# Patient Record
Sex: Male | Born: 1995 | Race: Black or African American | Hispanic: No | Marital: Single | Smoking: Current every day smoker
Health system: Southern US, Community
[De-identification: ages and names within clinical notes are randomized; demographics above are authoritative.]

## PROBLEM LIST (undated history)

## (undated) DIAGNOSIS — J45909 Unspecified asthma, uncomplicated: Secondary | ICD-10-CM

---

## 2017-12-05 ENCOUNTER — Encounter: Payer: Self-pay | Admitting: Emergency Medicine

## 2017-12-05 ENCOUNTER — Other Ambulatory Visit: Payer: Self-pay

## 2017-12-05 ENCOUNTER — Emergency Department
Admission: EM | Admit: 2017-12-05 | Discharge: 2017-12-05 | Disposition: A | Discharge status: Home / Self Care | Payer: Self-pay | Attending: Emergency Medicine | Admitting: Emergency Medicine

## 2017-12-05 ENCOUNTER — Emergency Department: Payer: Self-pay

## 2017-12-05 DIAGNOSIS — Y929 Unspecified place or not applicable: Secondary | ICD-10-CM | POA: Insufficient documentation

## 2017-12-05 DIAGNOSIS — Z23 Encounter for immunization: Secondary | ICD-10-CM | POA: Insufficient documentation

## 2017-12-05 DIAGNOSIS — Y939 Activity, unspecified: Secondary | ICD-10-CM | POA: Insufficient documentation

## 2017-12-05 DIAGNOSIS — S62667A Nondisplaced fracture of distal phalanx of left little finger, initial encounter for closed fracture: Secondary | ICD-10-CM | POA: Insufficient documentation

## 2017-12-05 DIAGNOSIS — Y33XXXA Other specified events, undetermined intent, initial encounter: Secondary | ICD-10-CM | POA: Insufficient documentation

## 2017-12-05 DIAGNOSIS — Y998 Other external cause status: Secondary | ICD-10-CM | POA: Insufficient documentation

## 2017-12-05 DIAGNOSIS — J45909 Unspecified asthma, uncomplicated: Secondary | ICD-10-CM | POA: Insufficient documentation

## 2017-12-05 DIAGNOSIS — F17210 Nicotine dependence, cigarettes, uncomplicated: Secondary | ICD-10-CM | POA: Insufficient documentation

## 2017-12-05 HISTORY — DX: Unspecified asthma, uncomplicated: J45.909

## 2017-12-05 LAB — CBC WITH DIFFERENTIAL/PLATELET
BASOS ABS: 0 10*3/uL (ref 0–0.1)
BASOS PCT: 1 %
EOS ABS: 0.2 10*3/uL (ref 0–0.7)
EOS PCT: 5 %
HCT: 44.9 % (ref 40.0–52.0)
Hemoglobin: 15.1 g/dL (ref 13.0–18.0)
Lymphocytes Relative: 22 %
Lymphs Abs: 1 10*3/uL (ref 1.0–3.6)
MCH: 29.6 pg (ref 26.0–34.0)
MCHC: 33.6 g/dL (ref 32.0–36.0)
MCV: 88.3 fL (ref 80.0–100.0)
MONO ABS: 0.5 10*3/uL (ref 0.2–1.0)
Monocytes Relative: 10 %
Neutro Abs: 3 10*3/uL (ref 1.4–6.5)
Neutrophils Relative %: 62 %
PLATELETS: 297 10*3/uL (ref 150–440)
RBC: 5.09 MIL/uL (ref 4.40–5.90)
RDW: 13.5 % (ref 11.5–14.5)
WBC: 4.8 10*3/uL (ref 3.8–10.6)

## 2017-12-05 LAB — COMPREHENSIVE METABOLIC PANEL
ALK PHOS: 112 U/L (ref 38–126)
ALT: 28 U/L (ref 17–63)
AST: 39 U/L (ref 15–41)
Albumin: 4.1 g/dL (ref 3.5–5.0)
Anion gap: 10 (ref 5–15)
BILIRUBIN TOTAL: 0.8 mg/dL (ref 0.3–1.2)
BUN: 11 mg/dL (ref 6–20)
CALCIUM: 9.1 mg/dL (ref 8.9–10.3)
CO2: 24 mmol/L (ref 22–32)
CREATININE: 1.07 mg/dL (ref 0.61–1.24)
Chloride: 104 mmol/L (ref 101–111)
GFR calc Af Amer: >60 mL/min (ref >60)
GLUCOSE: 116 mg/dL — AB (ref 65–99)
Potassium: 3.7 mmol/L (ref 3.5–5.1)
Sodium: 138 mmol/L (ref 135–145)
TOTAL PROTEIN: 7.5 g/dL (ref 6.5–8.1)

## 2017-12-05 MED ORDER — OXYCODONE-ACETAMINOPHEN 5-325 MG PO TABS: 1.0000 | ORAL_TABLET | ORAL | 0 refills | Status: DC | PRN

## 2017-12-05 MED ORDER — CEPHALEXIN 500 MG PO CAPS: 500.0000 mg | ORAL_CAPSULE | Freq: Three times a day (TID) | ORAL | 0 refills | Status: AC

## 2017-12-05 MED ORDER — CEPHALEXIN 500 MG PO CAPS: 500.0000 mg | ORAL_CAPSULE | Freq: Four times a day (QID) | ORAL | 0 refills | Status: DC

## 2017-12-05 MED ORDER — CEPHALEXIN 500 MG PO CAPS: 500.0000 mg | ORAL_CAPSULE | Freq: Three times a day (TID) | ORAL | 0 refills | Status: DC

## 2017-12-05 MED ORDER — OXYCODONE-ACETAMINOPHEN 5-325 MG PO TABS
1.0000 | ORAL_TABLET | Freq: Once | ORAL | Status: AC
  Administered 2017-12-05: 1 via ORAL
  Filled 2017-12-05: qty 1

## 2017-12-05 MED ORDER — TETANUS-DIPHTH-ACELL PERTUSSIS 5-2.5-18.5 LF-MCG/0.5 IM SUSP
INTRAMUSCULAR | Status: AC
  Filled 2017-12-05: qty 0.5

## 2017-12-05 MED ORDER — TETANUS-DIPHTH-ACELL PERTUSSIS 5-2.5-18.5 LF-MCG/0.5 IM SUSP
0.5000 mL | Freq: Once | INTRAMUSCULAR | Status: AC
  Administered 2017-12-05: 0.5 mL via INTRAMUSCULAR

## 2017-12-05 MED ORDER — CEFAZOLIN SODIUM-DEXTROSE 1-4 GM/50ML-% IV SOLN
1.0000 g | Freq: Once | INTRAVENOUS | Status: AC
  Administered 2017-12-05: 1 g via INTRAVENOUS
  Filled 2017-12-05: qty 50
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
## 2017-12-05 NOTE — ED Notes (Signed)
Notified pharmacy of the need for Ancef for this patient.

## 2017-12-05 NOTE — Discharge Instructions (Addendum)
if any signs of infection including increased pain, fever, vomiting, pus from the area, swelling, red streaks from the area or other concerns return to the emergency room.  Follow up on Monday without fail with orthopedic surgery.  Do not drink or drive on pain medications and be careful drinking to excess.
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
## 2019-07-06 IMAGING — CR DG FINGER LITTLE 2+V*L*
1 series · 3 of 3 positions shown · non-contrast
Comparison: None.

CLINICAL DATA: Wound left little finger X 2 days.Per patient,
either caught finger in closing door or has Tamika Trost in finger.

EXAM:
LEFT LITTLE FINGER 2+V

[Series 4: x finger pa left · 0.14mm/px · 3 of 3 slices shown]
[im 1/3]
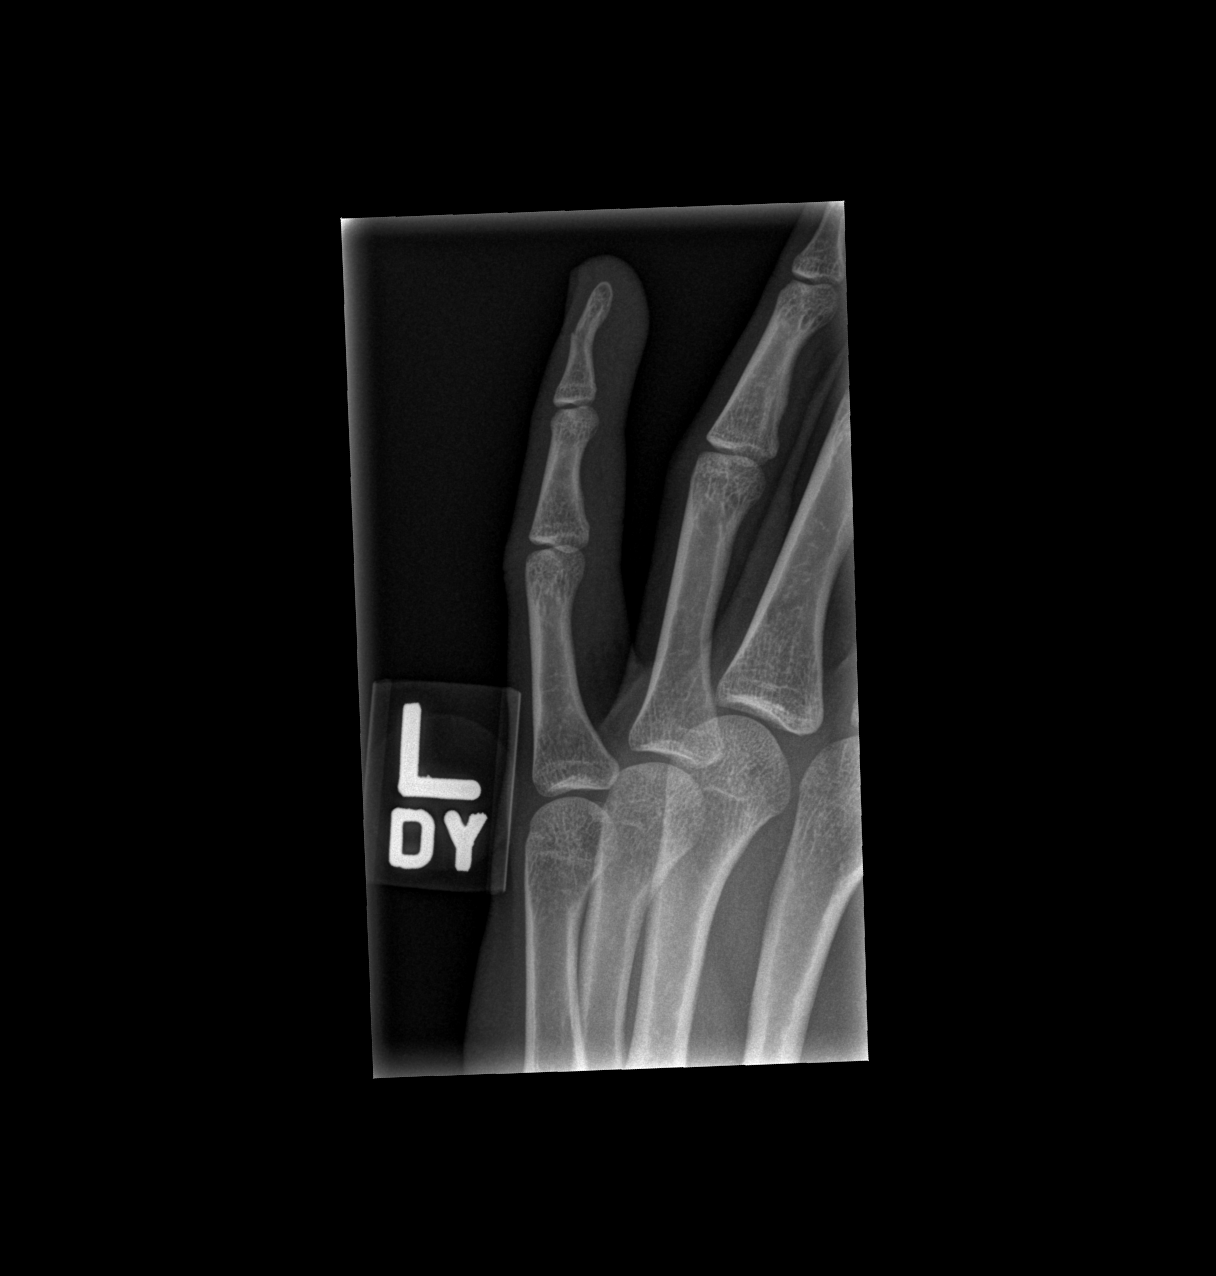
[im 2/3]
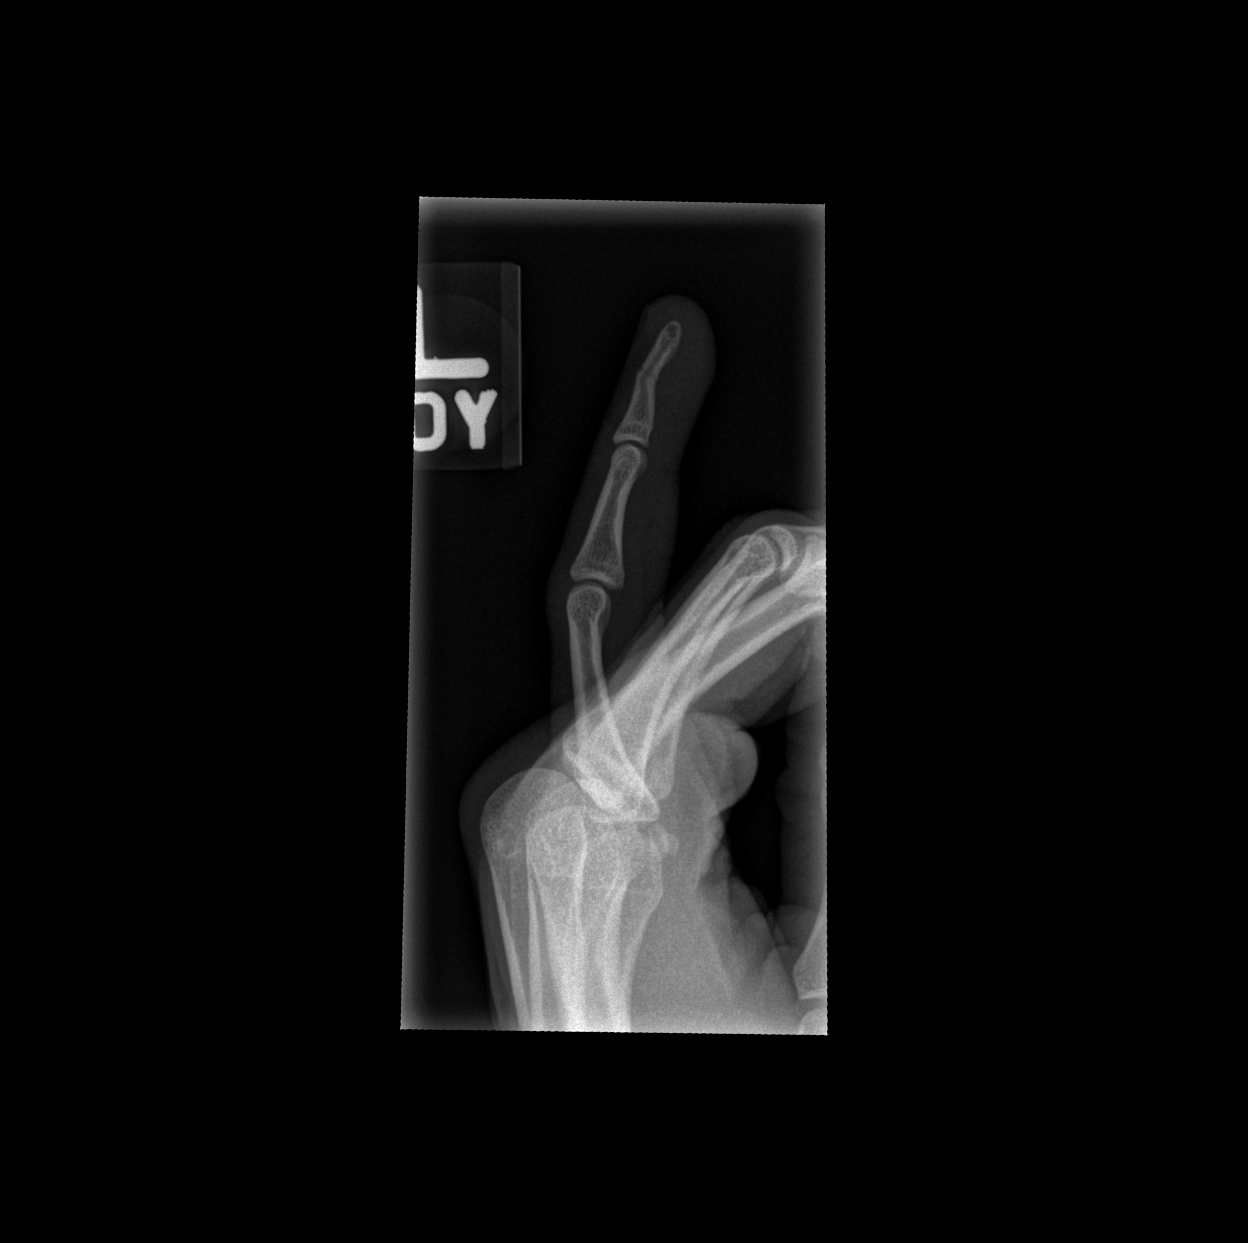
[im 3/3]
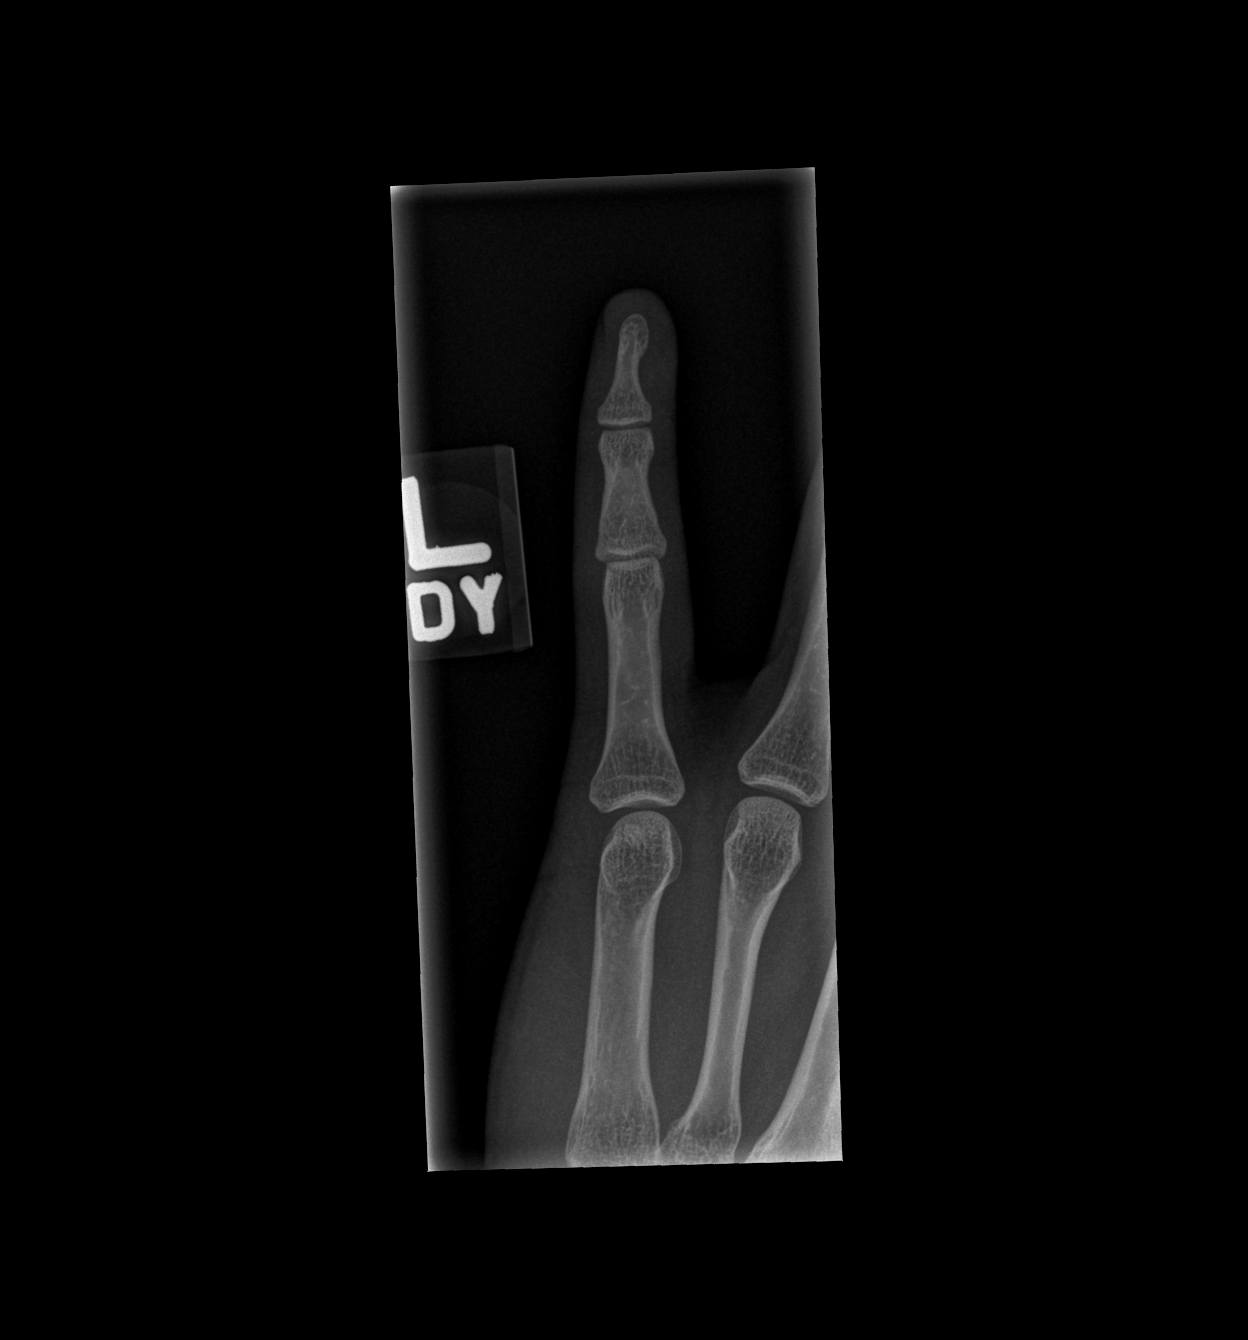

[3 of 3 positions shown; findings below may reference images not displayed]

FINDINGS: There is a transverse fracture of the midshaft of the distal phalanx
of the left fifth finger. Fracture is nondisplaced and non
comminuted. There is minor palmar angulation.

No other fractures.  The joints are normally spaced and aligned.
IMPRESSION: 1. Transverse, nondisplaced, non comminuted fracture of the midshaft
of the left fifth finger distal phalanx with Juan Felipe Chiu/anterior
angulation.
2. No radiopaque foreign body.

## 2020-10-19 ENCOUNTER — Other Ambulatory Visit: Payer: Self-pay

## 2020-10-19 ENCOUNTER — Emergency Department: Payer: BC Managed Care – PPO

## 2020-10-19 ENCOUNTER — Emergency Department
Admission: EM | Admit: 2020-10-19 | Discharge: 2020-10-19 | Disposition: A | Discharge status: Home / Self Care | Payer: BC Managed Care – PPO | Attending: Emergency Medicine | Admitting: Emergency Medicine

## 2020-10-19 DIAGNOSIS — Y9389 Activity, other specified: Secondary | ICD-10-CM | POA: Insufficient documentation

## 2020-10-19 DIAGNOSIS — S99912A Unspecified injury of left ankle, initial encounter: Secondary | ICD-10-CM | POA: Diagnosis present

## 2020-10-19 DIAGNOSIS — J45909 Unspecified asthma, uncomplicated: Secondary | ICD-10-CM | POA: Diagnosis not present

## 2020-10-19 DIAGNOSIS — X501XXA Overexertion from prolonged static or awkward postures, initial encounter: Secondary | ICD-10-CM | POA: Diagnosis not present

## 2020-10-19 DIAGNOSIS — F17210 Nicotine dependence, cigarettes, uncomplicated: Secondary | ICD-10-CM | POA: Insufficient documentation

## 2020-10-19 DIAGNOSIS — S93492A Sprain of other ligament of left ankle, initial encounter: Secondary | ICD-10-CM | POA: Diagnosis not present

## 2020-10-19 NOTE — ED Provider Notes (Signed)
[facility] Emergency Department Provider Note  ____________________________________________   Event Date/Time   First MD Initiated Contact with Patient 10/19/20 1224     (approximate)  I have reviewed the triage vital signs and the nursing notes.   HISTORY  Chief Complaint Ankle Pain  HPI Greg Flores is a 25 y.o. male who presents to the emergency department for evaluation of left ankle pain.  Patient states that he inverted his ankle when playing with a friend last night and had onset of pain.  He has been able to weight-bear but with increased pain.  He states he has history of multiple ankle sprains to both ankles before.  He rates his pain a 4/10, is worse with weightbearing.  He has not tried any alleviating measures.         Past Medical History:  Diagnosis Date  . Asthma     There are no problems to display for this patient.   History reviewed. No pertinent surgical history.  Prior to Admission medications   Not on File    Allergies Patient has no known allergies.  No family history on file.  Social History Social History   Tobacco Use  . Smoking status: Current Every Day Smoker    Types: E-cigarettes  . Smokeless tobacco: Never Used  Substance Use Topics  . Alcohol use: Yes  . Drug use: Yes    Types: Marijuana    Review of Systems Constitutional: No fever/chills Eyes: No visual changes. ENT: No sore throat. Cardiovascular: Denies chest pain. Respiratory: Denies shortness of breath. Gastrointestinal: No abdominal pain.  No nausea, no vomiting.  No diarrhea.  No constipation. Genitourinary: Negative for dysuria. Musculoskeletal: + Left ankle pain, negative for back pain. Skin: Negative for rash. Neurological: Negative for headaches, focal weakness or numbness.   ____________________________________________   PHYSICAL EXAM:  VITAL SIGNS: ED Triage Vitals  Enc Vitals Group     BP 10/19/20 1123 106/65     Pulse  Rate 10/19/20 1123 83     Resp 10/19/20 1123 17     Temp 10/19/20 1123 98 F (36.7 C)     Temp Source 10/19/20 1123 Oral     SpO2 10/19/20 1123 98 %     Weight 10/19/20 1124 150 lb (68 kg)     Height 10/19/20 1124 5\' 10"  (1.778 m)     Head Circumference --      Peak Flow --      Pain Score 10/19/20 1123 6     Pain Loc --      Pain Edu? --      Excl. in GC? --     Constitutional: Alert and oriented. Well appearing and in no acute distress. Eyes: Conjunctivae are normal. PERRL. EOMI. Head: Atraumatic. Nose: No congestion/rhinnorhea. Musculoskeletal: There is a mild to moderate amount of swelling over the dorsum of the lateral left ankle.  Patient is tender to touch in the region of the lateral ligaments, including ATFL.  Patient is nontender to palpation of the distal fibula, proximal fifth metatarsal.  Patient has full range of motion but with increased pain over the lateral ankle.  Dorsal pedal pulse 2+, capillary refill less than 3 seconds. Neurologic:  Normal speech and language. No gross focal neurologic deficits are appreciated. No gait instability. Skin:  Skin is warm, dry and intact. No rash noted. Psychiatric: Mood and affect are normal. Speech and behavior are normal. ____________________________________________  RADIOLOGY I, 10/21/20, personally viewed and  evaluated these images (plain radiographs) as part of my medical decision making, as well as reviewing the written report by the radiologist.  ED provider interpretation: No acute fracture identified.  Official radiology report(s): DG Ankle Complete Left  Result Date: 10/19/2020 CLINICAL DATA:  pain/injury EXAM: LEFT ANKLE COMPLETE - 3+ VIEW COMPARISON:  None. FINDINGS: There is no evidence of fracture, dislocation, or joint effusion. There is no evidence of arthropathy or other focal bone abnormality. Soft tissues are unremarkable. IMPRESSION: No acute osseous abnormality. Electronically Signed   By: Stana Bunting M.D.   On: 10/19/2020 11:52    ____________________________________________   INITIAL IMPRESSION / ASSESSMENT AND PLAN / ED COURSE  As part of my medical decision making, I reviewed the following data within the electronic MEDICAL RECORD NUMBER Nursing notes reviewed and incorporated and Radiograph reviewed         Patient is a 25 year old male who presents to the emergency department for evaluation of left ankle pain.  See HPI for further details.  On physical exam, the patient does have a mild to moderate amount of swelling over the lateral aspect of the left ankle.  He has full range of motion, is nontender over the bony prominences.  X-rays do not reveal any acute fracture.  Will place patient in an ASO lace up ankle brace.  Recommended alternating Tylenol or ibuprofen for symptom management.  Advised patient he can weight-bear as tolerated.  We will have the patient follow-up with orthopedics if he has any persisting issues.  Patient was amenable with this plan, he will return with any acute worsening.      ____________________________________________   FINAL CLINICAL IMPRESSION(S) / ED DIAGNOSES  Final diagnoses:  Sprain of anterior talofibular ligament of left ankle, initial encounter     ED Discharge Orders    None      *Please note:  Greg Flores was evaluated in Emergency Department on 10/19/2020 for the symptoms described in the history of present illness. He was evaluated in the context of the global COVID-19 pandemic, which necessitated consideration that the patient might be at risk for infection with the SARS-CoV-2 virus that causes COVID-19. Institutional protocols and algorithms that pertain to the evaluation of patients at risk for COVID-19 are in a state of rapid change based on information released by regulatory bodies including the CDC and federal and state organizations. These policies and algorithms were followed during the patient's care in the ED.  Some ED  evaluations and interventions may be delayed as a result of limited staffing during and the pandemic.*   Note:  This document was prepared using Dragon voice recognition software and may include unintentional dictation errors.   Lucy Chris, PA 10/19/20 1608    Dionne Bucy, MD 10/20/20 [phone]

## 2020-10-19 NOTE — ED Triage Notes (Signed)
Pt states he picked up one of his friends last night and rolled his left ankle and is now having pain and swelling

## 2022-05-20 IMAGING — CR DG ANKLE COMPLETE 3+V*L*
1 series · 3 of 3 positions shown · non-contrast
Comparison: None.

CLINICAL DATA: pain/injury

EXAM:
LEFT ANKLE COMPLETE - 3+ VIEW

[Series 2: x ankle ap left · 0.14mm/px · 3 of 3 slices shown]
[im 1/3]
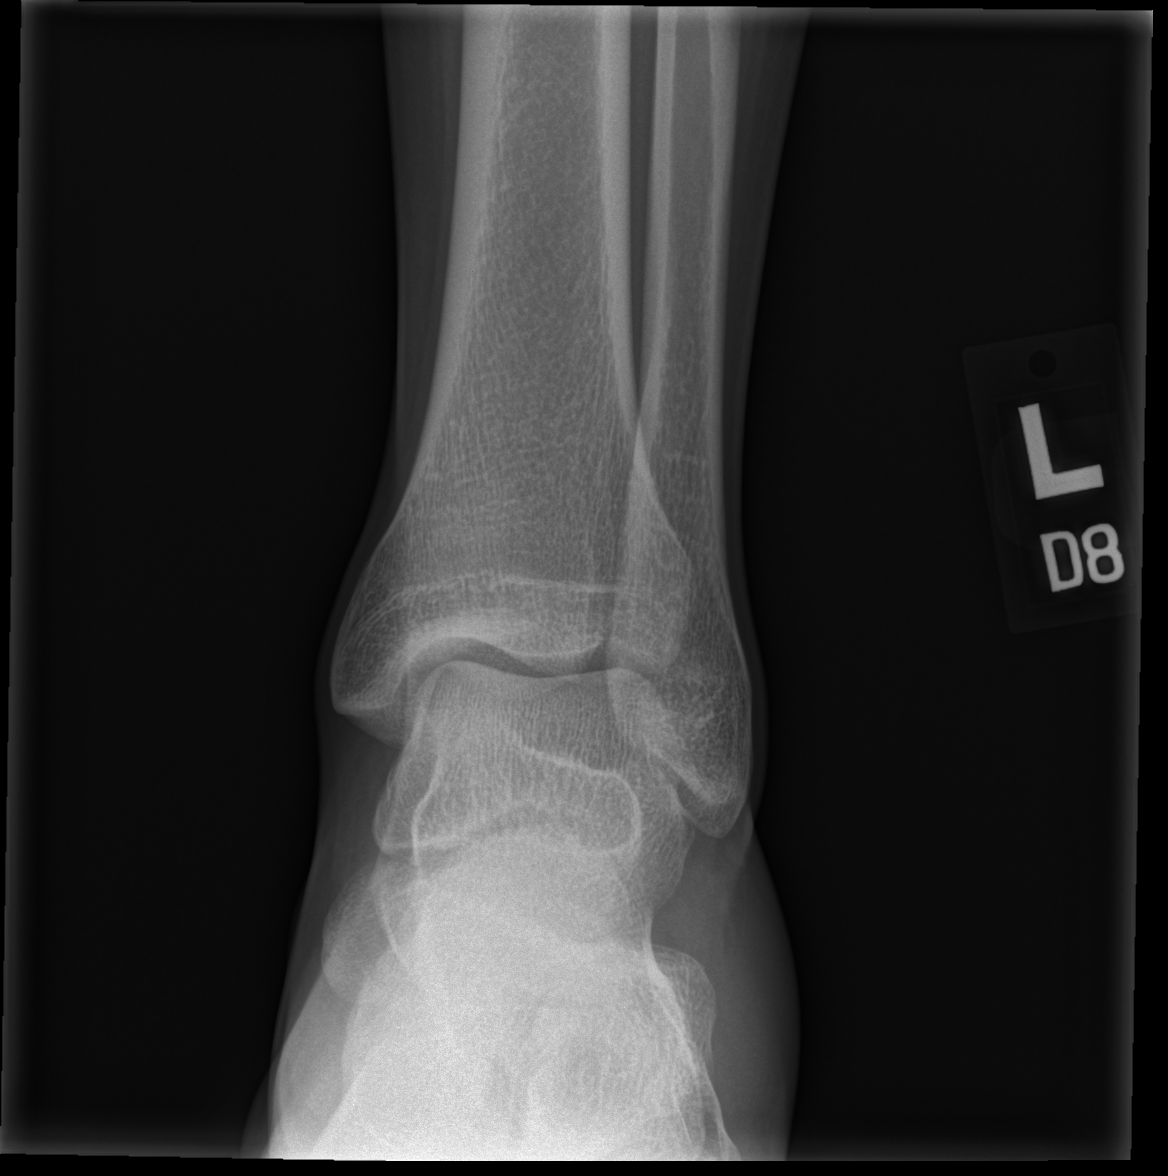
[im 2/3]
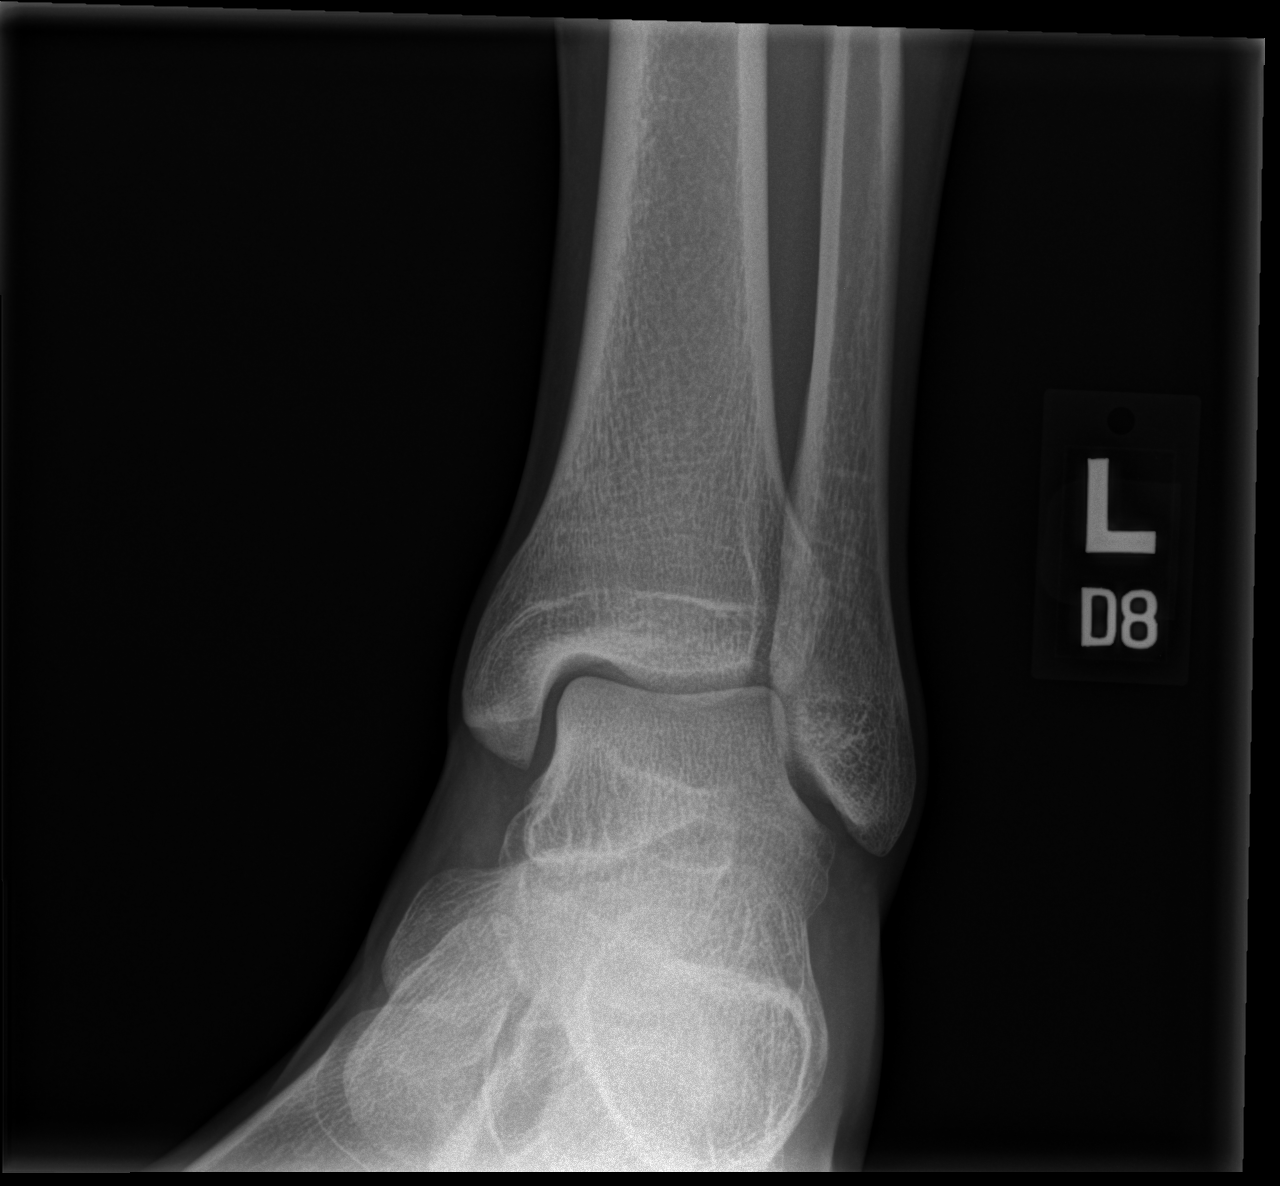
[im 3/3]
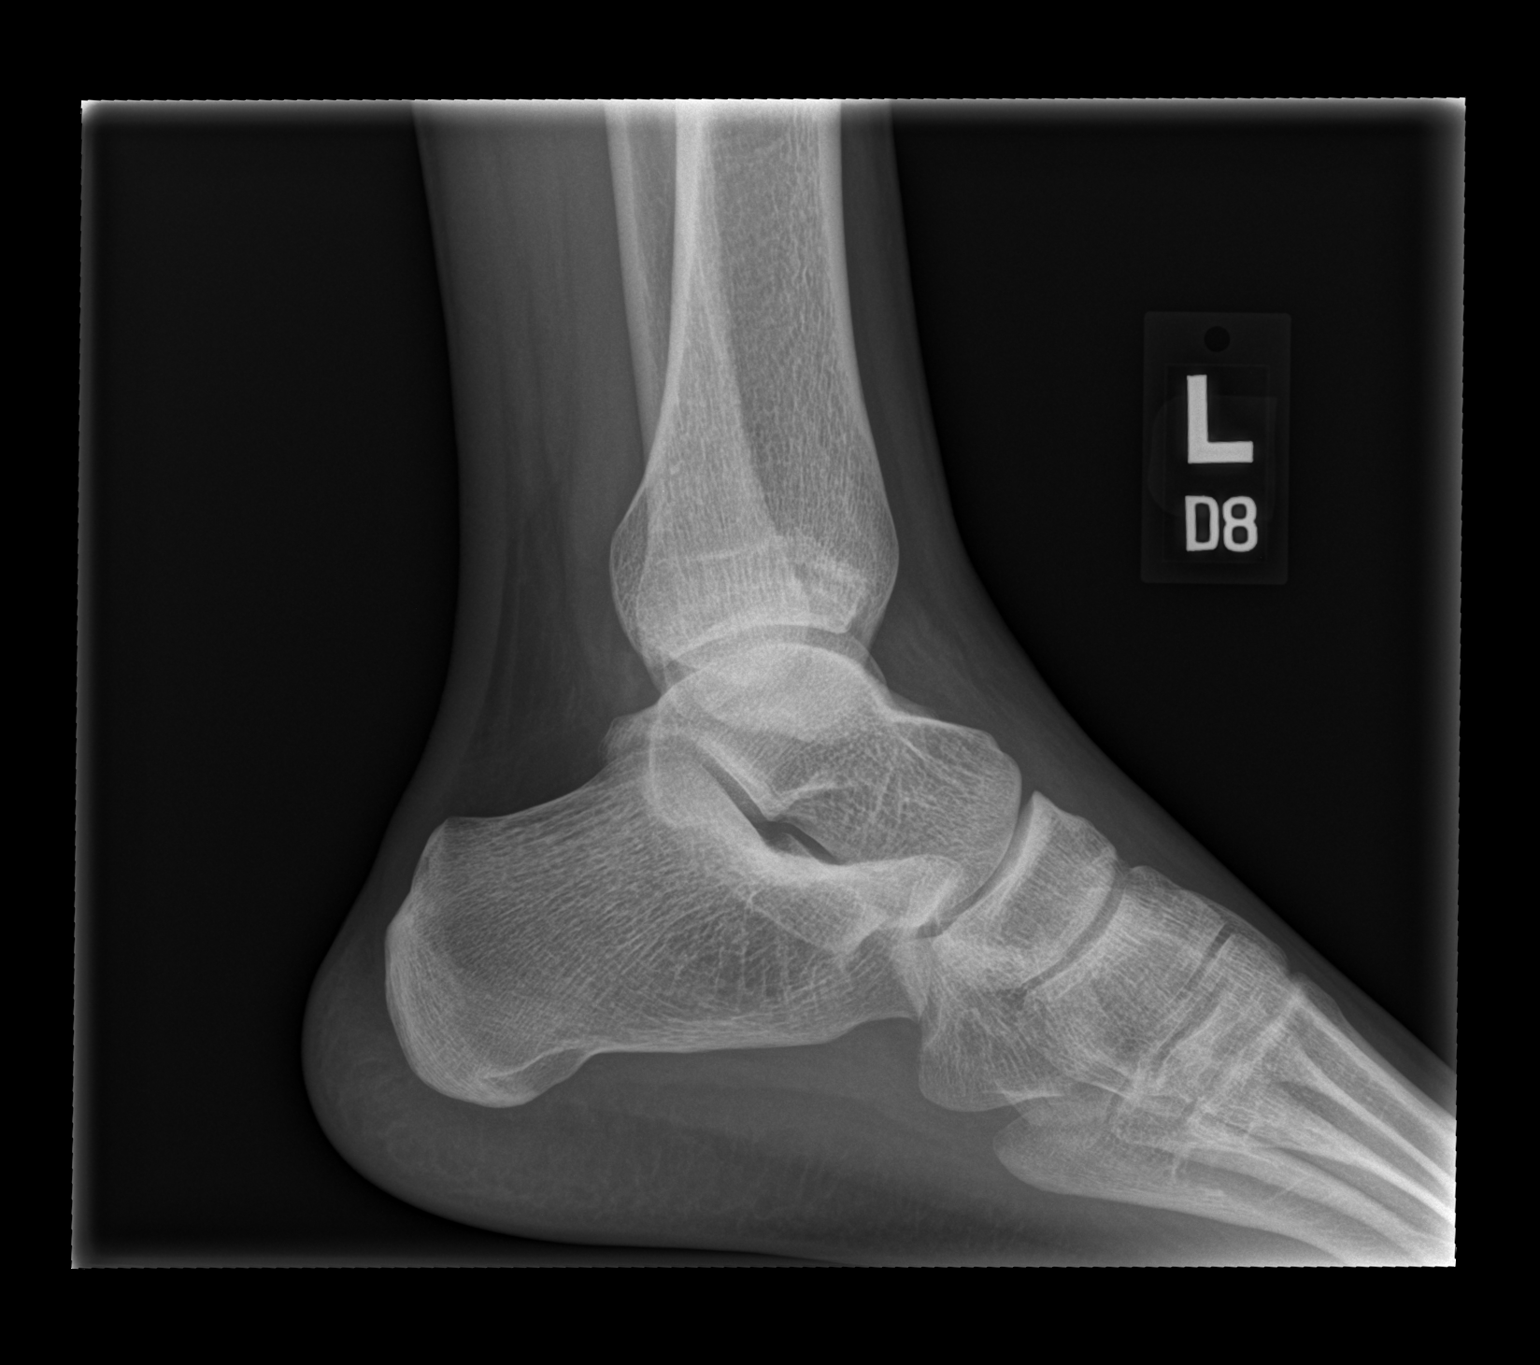

[3 of 3 positions shown; findings below may reference images not displayed]

FINDINGS: There is no evidence of fracture, dislocation, or joint effusion.
There is no evidence of arthropathy or other focal bone abnormality.
Soft tissues are unremarkable.
IMPRESSION: No acute osseous abnormality.
